# Patient Record
Sex: Male | Born: 1990 | Race: Black or African American | Hispanic: No | Marital: Single | Smoking: Current every day smoker
Health system: Southern US, Community
[De-identification: ages and names within clinical notes are randomized; demographics above are authoritative.]

## PROBLEM LIST (undated history)

## (undated) ENCOUNTER — Emergency Department (HOSPITAL_COMMUNITY): Admission: EM | Payer: Self-pay

## (undated) HISTORY — PX: NO PAST SURGERIES: SHX2092

---
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
## 2013-06-29 ENCOUNTER — Emergency Department (HOSPITAL_COMMUNITY)
Admission: EM | Admit: 2013-06-29 | Discharge: 2013-06-29 | Disposition: A | Discharge status: Home / Self Care | Payer: Self-pay | Attending: Emergency Medicine | Admitting: Emergency Medicine

## 2013-06-29 ENCOUNTER — Encounter (HOSPITAL_COMMUNITY): Payer: Self-pay | Admitting: Emergency Medicine

## 2013-06-29 DIAGNOSIS — F17200 Nicotine dependence, unspecified, uncomplicated: Secondary | ICD-10-CM | POA: Insufficient documentation

## 2013-06-29 DIAGNOSIS — J020 Streptococcal pharyngitis: Secondary | ICD-10-CM | POA: Insufficient documentation

## 2013-06-29 DIAGNOSIS — R0989 Other specified symptoms and signs involving the circulatory and respiratory systems: Secondary | ICD-10-CM | POA: Insufficient documentation

## 2013-06-29 DIAGNOSIS — R05 Cough: Secondary | ICD-10-CM | POA: Insufficient documentation

## 2013-06-29 DIAGNOSIS — J3489 Other specified disorders of nose and nasal sinuses: Secondary | ICD-10-CM | POA: Insufficient documentation

## 2013-06-29 DIAGNOSIS — R059 Cough, unspecified: Secondary | ICD-10-CM | POA: Insufficient documentation

## 2013-06-29 DIAGNOSIS — R0609 Other forms of dyspnea: Secondary | ICD-10-CM | POA: Insufficient documentation

## 2013-06-29 LAB — RAPID STREP SCREEN (MED CTR MEBANE ONLY): Streptococcus, Group A Screen (Direct): POSITIVE — AB

## 2013-06-29 MED ORDER — ACETAMINOPHEN-CODEINE #3 300-30 MG PO TABS: 1.0000 | ORAL_TABLET | Freq: Four times a day (QID) | ORAL | Status: DC | PRN

## 2013-06-29 MED ORDER — DEXAMETHASONE SODIUM PHOSPHATE 10 MG/ML IJ SOLN
10.0000 mg | Freq: Once | INTRAMUSCULAR | Status: AC
  Administered 2013-06-29: 10 mg via INTRAMUSCULAR
  Filled 2013-06-29: qty 1

## 2013-06-29 MED ORDER — PENICILLIN V POTASSIUM 500 MG PO TABS: 500.0000 mg | ORAL_TABLET | Freq: Three times a day (TID) | ORAL | Status: DC

## 2013-06-29 NOTE — ED Provider Notes (Signed)
Medical screening examination/treatment/procedure(s) were performed by non-physician practitioner and as supervising physician I was immediately available for consultation/collaboration.  EKG Interpretation   None         Morna Flud T Nira Visscher, MD 06/29/13 1713 

## 2013-06-29 NOTE — ED Notes (Signed)
Pt reports nonproductive cough, chills sore throat x 4 days. Tx with OTC meds

## 2013-06-29 NOTE — ED Provider Notes (Signed)
CSN: [id number]     Arrival date & time 06/29/13  1223 History  This chart was scribed for Brett Pel, PA-C, working with Audree Camel, MD, by [facility] ED Scribe. This patient was seen in room WTR5/WTR5 and the patient's care was started at 1:10 PM.   Chief Complaint  Patient presents with  . Sore Throat    x 4 days  . Cough    dry cough    The history is provided by the patient. No language interpreter was used.    HPI Comments: Brett Lang is a 22 y.o. male who presents to the Emergency Department complaining of a constant, gradually worsening sore throat over the past week. He reports an associated non-productive cough and nasal congestion over the past week. He states that he has had some trouble breathing at night due to congestion. He also reports intermittent subjective fever and chills over the past week. He states that he has not taken his temperature manually. He denies nausea, emesis, diarrhea, constipation, headache, eye pain, ear pain or any other recent symptoms.  History reviewed. No pertinent past medical history. History reviewed. No pertinent past surgical history. Family History  Problem Relation Age of Onset  . Diabetes Other    History  Substance Use Topics  . Smoking status: Current Every Day Smoker  . Smokeless tobacco: Not on file  . Alcohol Use: Yes    Review of Systems  Constitutional: Positive for fever (subjective) and chills.  HENT: Positive for congestion and sore throat. Negative for ear pain.   Eyes: Negative for pain.  Respiratory: Positive for cough.   Gastrointestinal: Negative for nausea, vomiting, diarrhea and constipation.  Neurological: Negative for headaches.  All other systems reviewed and are negative.   Allergies  Review of patient's allergies indicates no known allergies.  Home Medications   Current Outpatient Rx  Name  Route  Sig  Dispense  Refill  . acetaminophen-codeine (TYLENOL #3) 300-30 MG per tablet    Oral   Take 1-2 tablets by mouth every 6 (six) hours as needed for moderate pain.   15 tablet   0   . penicillin v potassium (VEETID) 500 MG tablet   Oral   Take 1 tablet (500 mg total) by mouth 3 (three) times daily.   30 tablet   0     Triage Vitals: BP 124/70  Pulse 90  Temp(Src) 98.2 F (36.8 C) (Oral)  Resp 18  SpO2 99%  Physical Exam  Nursing note and vitals reviewed. Constitutional: He is oriented to person, place, and time. He appears well-developed and well-nourished. No distress.  HENT:  Head: Normocephalic and atraumatic.  Right Ear: Tympanic membrane, external ear and ear canal normal.  Left Ear: Tympanic membrane, external ear and ear canal normal.  Nose: Nose normal. No rhinorrhea. Right sinus exhibits no maxillary sinus tenderness and no frontal sinus tenderness. Left sinus exhibits no maxillary sinus tenderness and no frontal sinus tenderness.  Mouth/Throat: Uvula is midline and mucous membranes are normal. No trismus in the jaw. Normal dentition. No dental abscesses or uvula swelling. Oropharyngeal exudate and posterior oropharyngeal edema present. No posterior oropharyngeal erythema or tonsillar abscesses.  No submental edema, tongue not elevated, no trismus. No impending airway obstruction; Pt able to speak full sentences, swallow intact, no drooling, stridor, or tonsillar/uvula displacement. No palatal petechia  Eyes: Conjunctivae and EOM are normal.  Neck: Trachea normal, normal range of motion and full passive range of motion without pain.  Neck supple. No rigidity. No tracheal deviation and normal range of motion present. No Brudzinski's sign noted.  Flexion and extension of neck without pain or difficulty. Able to breath without difficulty in extension.  Cardiovascular: Normal rate and regular rhythm.   Pulmonary/Chest: Effort normal and breath sounds normal. No stridor. No respiratory distress. He has no wheezes.  Abdominal: Soft. There is no tenderness.   No obvious evidence of splenomegaly. Non ttp.   Musculoskeletal: Normal range of motion.  Lymphadenopathy:       Head (right side): No preauricular and no posterior auricular adenopathy present.       Head (left side): No preauricular and no posterior auricular adenopathy present.    He has cervical adenopathy.  Neurological: He is alert and oriented to person, place, and time.  Skin: Skin is warm and dry. No rash noted. He is not diaphoretic.  Psychiatric: He has a normal mood and affect. His behavior is normal.    ED Course  Procedures (including critical care time)  DIAGNOSTIC STUDIES: Oxygen Saturation is 99% on RA, normal by my interpretation.    COORDINATION OF CARE: 1:15 PM- Pt advised of plan for treatment and pt agrees.  1:24 PM- Advised pt that he tested positive for Strep. Discussed plan for pt to be discharged with pain medication and antibiotics. Pt offered a shot of decadron in the ED and he agrees with this option.  acetaminophen-codeine (TYLENOL #3) 300-30 MG per tablet Take 1-2 tablets by mouth every 6 (six) hours as needed for moderate pain. 15 tablet Dorthula Matas, PA-C   penicillin v potassium (VEETID) 500 MG tablet Take 1 tablet (500 mg total) by mouth 3 (three) times daily. 30 tablet Dorthula Matas, PA-C        Labs Review Labs Reviewed  RAPID STREP SCREEN - Abnormal; Notable for the following:    Streptococcus, Group A Screen (Direct) POSITIVE (*)    All other components within normal limits   Imaging Review No results found.  EKG Interpretation   None       MDM   1. Strep throat      22 y.o.Karl S Lawford's evaluation in the Emergency Department is complete. It has been determined that no acute conditions requiring further emergency intervention are present at this time. The patient/guardian have been advised of the diagnosis and plan. We have discussed signs and symptoms that warrant return to the ED, such as changes or worsening in  symptoms.  Vital signs are stable at discharge. Filed Vitals:   06/29/13 1254  BP: 124/70  Pulse: 90  Temp: 98.2 F (36.8 C)  Resp: 18    Patient/guardian has voiced understanding and agreed to follow-up with the PCP or specialist.  I personally performed the services described in this documentation, which was scribed in my presence. The recorded information has been reviewed and is accurate.   Dorthula Matas, PA-C 06/29/13 1327

## 2016-08-14 ENCOUNTER — Emergency Department (HOSPITAL_COMMUNITY)
Admission: EM | Admit: 2016-08-14 | Discharge: 2016-08-14 | Disposition: A | Discharge status: Home / Self Care | Payer: Self-pay | Attending: Emergency Medicine | Admitting: Emergency Medicine

## 2016-08-14 ENCOUNTER — Emergency Department (HOSPITAL_COMMUNITY): Payer: Self-pay

## 2016-08-14 ENCOUNTER — Encounter (HOSPITAL_COMMUNITY): Payer: Self-pay | Admitting: Emergency Medicine

## 2016-08-14 DIAGNOSIS — J111 Influenza due to unidentified influenza virus with other respiratory manifestations: Secondary | ICD-10-CM | POA: Insufficient documentation

## 2016-08-14 DIAGNOSIS — F17200 Nicotine dependence, unspecified, uncomplicated: Secondary | ICD-10-CM | POA: Insufficient documentation

## 2016-08-14 LAB — CBC WITH DIFFERENTIAL/PLATELET
Basophils Absolute: 0 10*3/uL (ref 0.0–0.1)
Basophils Relative: 0 %
Eosinophils Absolute: 0.1 10*3/uL (ref 0.0–0.7)
Eosinophils Relative: 2 %
HCT: 43.5 % (ref 39.0–52.0)
Hemoglobin: 15.7 g/dL (ref 13.0–17.0)
Lymphocytes Relative: 13 %
Lymphs Abs: 0.8 10*3/uL (ref 0.7–4.0)
MCH: 32.3 pg (ref 26.0–34.0)
MCHC: 36.1 g/dL — ABNORMAL HIGH (ref 30.0–36.0)
MCV: 89.5 fL (ref 78.0–100.0)
Monocytes Absolute: 0.6 10*3/uL (ref 0.1–1.0)
Monocytes Relative: 10 %
Neutro Abs: 4.3 10*3/uL (ref 1.7–7.7)
Neutrophils Relative %: 75 %
Platelets: 245 10*3/uL (ref 150–400)
RBC: 4.86 MIL/uL (ref 4.22–5.81)
RDW: 12.7 % (ref 11.5–15.5)
WBC: 5.8 10*3/uL (ref 4.0–10.5)

## 2016-08-14 LAB — COMPREHENSIVE METABOLIC PANEL
ALT: 20 U/L (ref 17–63)
AST: 25 U/L (ref 15–41)
Albumin: 3.8 g/dL (ref 3.5–5.0)
Alkaline Phosphatase: 65 U/L (ref 38–126)
Anion gap: 7 (ref 5–15)
BUN: 10 mg/dL (ref 6–20)
CO2: 23 mmol/L (ref 22–32)
Calcium: 9 mg/dL (ref 8.9–10.3)
Chloride: 106 mmol/L (ref 101–111)
Creatinine, Ser: 1.41 mg/dL — ABNORMAL HIGH (ref 0.61–1.24)
GFR calc Af Amer: >60 mL/min (ref >60)
GFR calc non Af Amer: >60 mL/min (ref >60)
Glucose, Bld: 91 mg/dL (ref 65–99)
Potassium: 3.8 mmol/L (ref 3.5–5.1)
Sodium: 136 mmol/L (ref 135–145)
Total Bilirubin: 0.3 mg/dL (ref 0.3–1.2)
Total Protein: 7 g/dL (ref 6.5–8.1)

## 2016-08-14 LAB — URINALYSIS, ROUTINE W REFLEX MICROSCOPIC
Bilirubin Urine: NEGATIVE
Glucose, UA: NEGATIVE mg/dL
Hgb urine dipstick: NEGATIVE
Ketones, ur: NEGATIVE mg/dL
Leukocytes, UA: NEGATIVE
Nitrite: NEGATIVE
Protein, ur: NEGATIVE mg/dL
Specific Gravity, Urine: 1.021 (ref 1.005–1.030)
pH: 6 (ref 5.0–8.0)

## 2016-08-14 LAB — I-STAT CG4 LACTIC ACID, ED: Lactic Acid, Venous: 1.24 mmol/L (ref 0.5–1.9)

## 2016-08-14 MED ORDER — ACETAMINOPHEN 500 MG PO TABS
500.0000 mg | ORAL_TABLET | Freq: Once | ORAL | Status: AC
  Administered 2016-08-14: 500 mg via ORAL
  Filled 2016-08-14: qty 1

## 2016-08-14 MED ORDER — ACETAMINOPHEN 325 MG PO TABS
650.0000 mg | ORAL_TABLET | Freq: Once | ORAL | Status: AC
  Administered 2016-08-14: 650 mg via ORAL

## 2016-08-14 MED ORDER — OSELTAMIVIR PHOSPHATE 75 MG PO CAPS: 75.0000 mg | ORAL_CAPSULE | Freq: Two times a day (BID) | ORAL | 0 refills | Status: DC

## 2016-08-14 MED ORDER — ACETAMINOPHEN 325 MG PO TABS
ORAL_TABLET | ORAL | Status: AC
  Filled 2016-08-14: qty 2

## 2016-08-14 NOTE — ED Triage Notes (Signed)
Pt has a fever of 103.1-- girlfriend has flu-- has been on tamiflu-- pt took one of girlfriend's tamiflu tablets. Cough/fever/headache/backache

## 2016-08-14 NOTE — ED Provider Notes (Signed)
[facility]-EMERGENCY DEPT Provider Note   CSN: [id number] Arrival date & time: 08/14/16  1648     History   Chief Complaint Chief Complaint  Patient presents with  . flu like symptoms  . Fever    HPI Brett Lang is a 25 y.o. male.  HPI Patient has had URI symptoms cough fevers chills and feeling bad. His girlfriend was diagnosed with the flu. Patient also has a 3 day old baby at home. He was told that he needs to be on Tamiflu so baby can go back to the house. No nausea. No vomiting. Slight sore throat with swallowing. History reviewed. No pertinent past medical history.  There are no active problems to display for this patient.   History reviewed. No pertinent surgical history.     Home Medications    Prior to Admission medications   Medication Sig Start Date End Date Taking? Authorizing Provider  acetaminophen-codeine (TYLENOL #3) 300-30 MG per tablet Take 1-2 tablets by mouth every 6 (six) hours as needed for moderate pain. 06/29/13   Marlon Peliffany Greene, PA-C  oseltamivir (TAMIFLU) 75 MG capsule Take 1 capsule (75 mg total) by mouth every 12 (twelve) hours. 08/14/16   Benjiman CoreNathan Emonnie Cannady, MD  penicillin v potassium (VEETID) 500 MG tablet Take 1 tablet (500 mg total) by mouth 3 (three) times daily. 06/29/13   Marlon Peliffany Greene, PA-C    Family History Family History  Problem Relation Age of Onset  . Diabetes Other     Social History Social History  Substance Use Topics  . Smoking status: Current Every Day Smoker  . Smokeless tobacco: Not on file  . Alcohol use Yes     Allergies   Patient has no known allergies.   Review of Systems Review of Systems  Constitutional: Positive for appetite change, fatigue and fever.  HENT: Positive for congestion.   Respiratory: Positive for cough. Negative for shortness of breath.   Gastrointestinal: Negative for abdominal pain, constipation, nausea and vomiting.  Genitourinary: Negative for dysuria and flank pain.    Musculoskeletal: Negative for back pain.  Neurological: Negative for numbness.     Physical Exam Updated Vital Signs BP 115/60   Pulse 93   Temp 102 F (38.9 C) (Oral)   Resp 21   Ht 5\' 6"  (1.676 m)   Wt 175 lb (79.4 kg)   SpO2 99%   BMI 28.25 kg/m   Physical Exam  Constitutional: He appears well-developed.  HENT:  Minimal posterior erythema with one small area of exudate on right tonsil.  Eyes: EOM are normal.  Cardiovascular: Normal rate.   Pulmonary/Chest: Effort normal.  Slightly harsh breath sounds.  Abdominal: Soft. There is no tenderness.  Musculoskeletal: Normal range of motion. He exhibits no edema.  Neurological: He is alert.  Skin: Skin is warm.     ED Treatments / Results  Labs (all labs ordered are listed, but only abnormal results are displayed) Labs Reviewed  COMPREHENSIVE METABOLIC PANEL - Abnormal; Notable for the following:       Result Value   Creatinine, Ser 1.41 (*)    All other components within normal limits  CBC WITH DIFFERENTIAL/PLATELET - Abnormal; Notable for the following:    MCHC 36.1 (*)    All other components within normal limits  URINE CULTURE  URINALYSIS, ROUTINE W REFLEX MICROSCOPIC  I-STAT CG4 LACTIC ACID, ED    EKG  EKG Interpretation None       Radiology Dg Chest 2 View  Result Date: 08/14/2016  CLINICAL DATA:  Cough and chest congestion and fever. EXAM: CHEST  2 VIEW COMPARISON:  None. FINDINGS: Peribronchial thickening consistent with bronchitis. The lungs are otherwise clear. Heart size and vascularity are normal. No bone abnormality. IMPRESSION: Bronchitic changes. Electronically Signed   By: Francene BoyersJames  Maxwell M.D.   On: 08/14/2016 18:08    Procedures Procedures (including critical care time)  Medications Ordered in ED Medications  acetaminophen (TYLENOL) tablet 650 mg (650 mg Oral Given 08/14/16 1709)     Initial Impression / Assessment and Plan / ED Course  I have reviewed the triage vital signs and the  nursing notes.  Pertinent labs & imaging results that were available during my care of the patient were reviewed by me and considered in my medical decision making (see chart for details).  Clinical Course     Patient with fever. Likely influenza. His had symptoms for about 2 days but will treat since he has a 843-day-old daughter at home. She is also reportedly on prophylaxis. Patient does not want IV fluid. Informed of his mildly elevated creatinine. Ask for financial assistance for the Tamiflu but case management is not here at this time.  Final Clinical Impressions(s) / ED Diagnoses   Final diagnoses:  Influenza    New Prescriptions New Prescriptions   OSELTAMIVIR (TAMIFLU) 75 MG CAPSULE    Take 1 capsule (75 mg total) by mouth every 12 (twelve) hours.     Benjiman CoreNathan Menelik Mcfarren, MD 08/14/16 [phone]

## 2016-08-16 LAB — URINE CULTURE: Culture: NO GROWTH

## 2017-02-18 ENCOUNTER — Encounter (HOSPITAL_COMMUNITY): Payer: Self-pay

## 2017-02-18 ENCOUNTER — Emergency Department (HOSPITAL_COMMUNITY): Payer: Self-pay

## 2017-02-18 ENCOUNTER — Emergency Department (HOSPITAL_COMMUNITY)
Admission: EM | Admit: 2017-02-18 | Discharge: 2017-02-18 | Disposition: A | Discharge status: Home / Self Care | Payer: Self-pay | Attending: Emergency Medicine | Admitting: Emergency Medicine

## 2017-02-18 DIAGNOSIS — F17200 Nicotine dependence, unspecified, uncomplicated: Secondary | ICD-10-CM | POA: Insufficient documentation

## 2017-02-18 DIAGNOSIS — M25512 Pain in left shoulder: Secondary | ICD-10-CM | POA: Insufficient documentation

## 2017-02-18 DIAGNOSIS — S4992XA Unspecified injury of left shoulder and upper arm, initial encounter: Secondary | ICD-10-CM

## 2017-02-18 MED ORDER — KETOROLAC TROMETHAMINE 60 MG/2ML IM SOLN
60.0000 mg | Freq: Once | INTRAMUSCULAR | Status: AC
  Administered 2017-02-18: 60 mg via INTRAMUSCULAR
  Filled 2017-02-18: qty 2

## 2017-02-18 MED ORDER — HYDROCODONE-ACETAMINOPHEN 5-325 MG PO TABS: 1.0000 | ORAL_TABLET | ORAL | 0 refills | Status: DC | PRN
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
## 2020-05-04 ENCOUNTER — Other Ambulatory Visit: Payer: Self-pay

## 2020-05-04 ENCOUNTER — Emergency Department (HOSPITAL_COMMUNITY): Payer: Self-pay

## 2020-05-04 ENCOUNTER — Emergency Department (HOSPITAL_COMMUNITY)
Admission: EM | Admit: 2020-05-04 | Discharge: 2020-05-05 | Disposition: A | Discharge status: Home / Self Care | Payer: Self-pay | Attending: Emergency Medicine | Admitting: Emergency Medicine

## 2020-05-04 ENCOUNTER — Encounter (HOSPITAL_COMMUNITY): Payer: Self-pay | Admitting: Emergency Medicine

## 2020-05-04 DIAGNOSIS — S92912B Unspecified fracture of left toe(s), initial encounter for open fracture: Secondary | ICD-10-CM | POA: Insufficient documentation

## 2020-05-04 DIAGNOSIS — Z23 Encounter for immunization: Secondary | ICD-10-CM | POA: Insufficient documentation

## 2020-05-04 DIAGNOSIS — F17210 Nicotine dependence, cigarettes, uncomplicated: Secondary | ICD-10-CM | POA: Insufficient documentation

## 2020-05-04 DIAGNOSIS — W3400XA Accidental discharge from unspecified firearms or gun, initial encounter: Secondary | ICD-10-CM | POA: Insufficient documentation

## 2020-05-04 DIAGNOSIS — Z20822 Contact with and (suspected) exposure to covid-19: Secondary | ICD-10-CM | POA: Insufficient documentation

## 2020-05-04 LAB — SARS CORONAVIRUS 2 BY RT PCR (HOSPITAL ORDER, PERFORMED IN ~~LOC~~ HOSPITAL LAB): SARS Coronavirus 2: NEGATIVE

## 2020-05-04 MED ORDER — HYDROMORPHONE HCL 1 MG/ML IJ SOLN
1.0000 mg | Freq: Once | INTRAMUSCULAR | Status: AC
  Administered 2020-05-04: 1 mg via INTRAVENOUS
  Filled 2020-05-04: qty 1

## 2020-05-04 MED ORDER — CEFAZOLIN SODIUM-DEXTROSE 2-4 GM/100ML-% IV SOLN
2.0000 g | Freq: Once | INTRAVENOUS | Status: AC
  Administered 2020-05-04: 2 g via INTRAVENOUS
  Filled 2020-05-04: qty 100

## 2020-05-04 MED ORDER — KETOROLAC TROMETHAMINE 15 MG/ML IJ SOLN
15.0000 mg | Freq: Once | INTRAMUSCULAR | Status: AC
  Administered 2020-05-04: 15 mg via INTRAVENOUS
  Filled 2020-05-04: qty 1

## 2020-05-04 MED ORDER — CIPROFLOXACIN HCL 500 MG PO TABS: 500.0000 mg | ORAL_TABLET | Freq: Two times a day (BID) | ORAL | 0 refills | Status: AC

## 2020-05-04 MED ORDER — SODIUM CHLORIDE 0.9 % IV BOLUS
500.0000 mL | Freq: Once | INTRAVENOUS | Status: AC
  Administered 2020-05-04: 500 mL via INTRAVENOUS

## 2020-05-04 MED ORDER — MORPHINE SULFATE (PF) 4 MG/ML IV SOLN
4.0000 mg | Freq: Once | INTRAVENOUS | Status: AC
  Administered 2020-05-04: 4 mg via INTRAVENOUS
  Filled 2020-05-04: qty 1

## 2020-05-04 MED ORDER — TETANUS-DIPHTH-ACELL PERTUSSIS 5-2.5-18.5 LF-MCG/0.5 IM SUSP
0.5000 mL | Freq: Once | INTRAMUSCULAR | Status: AC
  Administered 2020-05-04: 0.5 mL via INTRAMUSCULAR
  Filled 2020-05-04: qty 0.5

## 2020-05-04 MED ORDER — KETAMINE HCL 50 MG/5ML IJ SOSY
0.3000 mg/kg | PREFILLED_SYRINGE | Freq: Once | INTRAMUSCULAR | Status: AC
  Administered 2020-05-04: 24 mg via INTRAVENOUS
  Filled 2020-05-04: qty 5

## 2020-05-04 MED ORDER — OXYCODONE-ACETAMINOPHEN 5-325 MG PO TABS
1.0000 | ORAL_TABLET | Freq: Once | ORAL | Status: AC
  Administered 2020-05-04: 1 via ORAL
  Filled 2020-05-04: qty 1

## 2020-05-04 MED ORDER — OXYCODONE-ACETAMINOPHEN 5-325 MG PO TABS: 1.0000 | ORAL_TABLET | Freq: Four times a day (QID) | ORAL | 0 refills | Status: DC | PRN

## 2020-05-04 NOTE — Discharge Instructions (Addendum)
Please change the dressing twice a day.  Please do not bear any weight on your left leg. Do not soak or submerge your wounds.  If you do that you will get infected despite taking antibiotics. Please call Dr. Warren Danes office first thing in the morning to set up a follow-up appointment.  You may have diarrhea from the antibiotics.  It is very important that you continue to take the antibiotics even if you get diarrhea unless a medical professional tells you that you may stop taking them.  If you stop too early the bacteria you are being treated for will become stronger and you may need different, more powerful antibiotics that have more side effects and worsening diarrhea.  Please stay well hydrated and consider probiotics as they may decrease the severity of your diarrhea.   Please take Ibuprofen (Advil, motrin) and Tylenol (acetaminophen) to relieve your pain.  You may take up to 600 MG (3 pills) of normal strength ibuprofen every 8 hours as needed.  In between doses of ibuprofen you make take tylenol, up to 1,000 mg (two extra strength pills).  Do not take more than 3,000 mg tylenol in a 24 hour period.  Please check all medication labels as many medications such as pain and cold medications may contain tylenol.  Do not drink alcohol while taking these medications.  Do not take other NSAID'S while taking ibuprofen (such as aleve or naproxen).  Please take ibuprofen with food to decrease stomach upset.  Today you received medications that may make you sleepy or impair your ability to make decisions.  For the next 24 hours please do not drive, operate heavy machinery, care for a small child with out another adult present, or perform any activities that may cause harm to you or someone else if you were to fall asleep or be impaired.   You are being prescribed a medication which may make you sleepy. Please follow up of listed precautions for at least 24 hours after taking one dose.
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
## 2020-05-05 NOTE — Progress Notes (Signed)
Orthopedic Tech Progress Note Patient Details:  Brett Lang February 24, 1991 [id number]  Ortho Devices Type of Ortho Device: Crutches, CAM walker Ortho Device/Splint Location: lle Ortho Device/Splint Interventions: Ordered, Application, Adjustment   Post Interventions Patient Tolerated: Well Instructions Provided: Care of device, Adjustment of device   Trinna Post 05/05/2020, 12:38 AM

## 2020-05-06 ENCOUNTER — Telehealth: Payer: Self-pay

## 2020-05-06 NOTE — Telephone Encounter (Signed)
Called patient to get him an appt on Wed. No answer.

## 2020-05-06 NOTE — Telephone Encounter (Signed)
Called patient no answer. Could not leave VM. Will try again later.  

## 2020-05-06 NOTE — Telephone Encounter (Signed)
needs f/u appt Received: Today Tarry Kos, MD  Albertina Parr, RMA Marisue Ivan,  This patient came through ED Sunday night. Can we get him in to see me wed morning please. Thanks.

## 2020-05-07 NOTE — Telephone Encounter (Signed)
Called again no answer  

## 2020-05-07 NOTE — Telephone Encounter (Signed)
I've tried to contact patient multiple times. No answer. Could not leave VM. He is scheduled to see Mardella Layman 05/12/2020.    Mardella Layman are you okay seeing patient?

## 2020-05-08 NOTE — Telephone Encounter (Signed)
Patient did not answer again °

## 2020-05-08 NOTE — Telephone Encounter (Signed)
Can we have him come in tomorrow? 

## 2020-05-09 ENCOUNTER — Encounter: Payer: Self-pay | Admitting: Orthopaedic Surgery

## 2020-05-09 ENCOUNTER — Ambulatory Visit (INDEPENDENT_AMBULATORY_CARE_PROVIDER_SITE_OTHER): Payer: Self-pay | Admitting: Orthopaedic Surgery

## 2020-05-09 VITALS — Ht 66.0 in | Wt 175.0 lb

## 2020-05-09 DIAGNOSIS — W3400XA Accidental discharge from unspecified firearms or gun, initial encounter: Secondary | ICD-10-CM

## 2020-05-09 DIAGNOSIS — S91135A Puncture wound without foreign body of left lesser toe(s) without damage to nail, initial encounter: Secondary | ICD-10-CM

## 2020-05-09 MED ORDER — GABAPENTIN 100 MG PO CAPS: 100.0000 mg | ORAL_CAPSULE | Freq: Three times a day (TID) | ORAL | 3 refills | Status: AC
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
## 2020-05-12 ENCOUNTER — Encounter (HOSPITAL_BASED_OUTPATIENT_CLINIC_OR_DEPARTMENT_OTHER): Payer: Self-pay | Admitting: Orthopaedic Surgery

## 2020-05-12 ENCOUNTER — Other Ambulatory Visit (HOSPITAL_COMMUNITY)
Admission: RE | Admit: 2020-05-12 | Discharge: 2020-05-12 | Disposition: A | Discharge status: Home / Self Care | Payer: HRSA Program | Source: Ambulatory Visit | Attending: Orthopaedic Surgery | Admitting: Orthopaedic Surgery

## 2020-05-12 ENCOUNTER — Other Ambulatory Visit: Payer: Self-pay

## 2020-05-12 ENCOUNTER — Ambulatory Visit: Payer: Self-pay | Admitting: Physician Assistant

## 2020-05-12 DIAGNOSIS — Z01812 Encounter for preprocedural laboratory examination: Secondary | ICD-10-CM | POA: Insufficient documentation

## 2020-05-12 DIAGNOSIS — Z20822 Contact with and (suspected) exposure to covid-19: Secondary | ICD-10-CM | POA: Diagnosis not present

## 2020-05-12 LAB — SARS CORONAVIRUS 2 (TAT 6-24 HRS): SARS Coronavirus 2: NEGATIVE

## 2020-05-15 ENCOUNTER — Encounter (HOSPITAL_BASED_OUTPATIENT_CLINIC_OR_DEPARTMENT_OTHER)
Admission: RE | Disposition: A | Discharge status: Home / Self Care | Payer: Self-pay | Source: Home / Self Care | Attending: Orthopaedic Surgery

## 2020-05-15 ENCOUNTER — Encounter (HOSPITAL_BASED_OUTPATIENT_CLINIC_OR_DEPARTMENT_OTHER): Payer: Self-pay | Admitting: Orthopaedic Surgery

## 2020-05-15 ENCOUNTER — Ambulatory Visit (HOSPITAL_BASED_OUTPATIENT_CLINIC_OR_DEPARTMENT_OTHER)
Admission: RE | Admit: 2020-05-15 | Discharge: 2020-05-15 | Disposition: A | Discharge status: Home / Self Care | Payer: Self-pay | Attending: Orthopaedic Surgery | Admitting: Orthopaedic Surgery

## 2020-05-15 DIAGNOSIS — S91135A Puncture wound without foreign body of left lesser toe(s) without damage to nail, initial encounter: Secondary | ICD-10-CM

## 2020-05-15 DIAGNOSIS — W3400XA Accidental discharge from unspecified firearms or gun, initial encounter: Secondary | ICD-10-CM | POA: Insufficient documentation

## 2020-05-15 DIAGNOSIS — Z79899 Other long term (current) drug therapy: Secondary | ICD-10-CM | POA: Insufficient documentation

## 2020-05-15 DIAGNOSIS — F17200 Nicotine dependence, unspecified, uncomplicated: Secondary | ICD-10-CM | POA: Insufficient documentation

## 2020-05-15 DIAGNOSIS — S91342A Puncture wound with foreign body, left foot, initial encounter: Secondary | ICD-10-CM | POA: Insufficient documentation

## 2020-05-15 SURGERY — IRRIGATION AND DEBRIDEMENT EXTREMITY
Anesthesia: General | Laterality: Left

## 2020-05-15 MED ORDER — LACTATED RINGERS IV SOLN: INTRAVENOUS | Status: DC

## 2020-05-15 MED ORDER — CEFAZOLIN SODIUM-DEXTROSE 2-4 GM/100ML-% IV SOLN
INTRAVENOUS | Status: AC
  Filled 2020-05-15: qty 100

## 2020-05-15 MED ORDER — CEFAZOLIN SODIUM-DEXTROSE 2-4 GM/100ML-% IV SOLN: 2.0000 g | INTRAVENOUS | Status: DC
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
## 2020-05-19 ENCOUNTER — Other Ambulatory Visit: Payer: Self-pay

## 2020-05-19 ENCOUNTER — Encounter (HOSPITAL_BASED_OUTPATIENT_CLINIC_OR_DEPARTMENT_OTHER): Payer: Self-pay | Admitting: Orthopaedic Surgery

## 2020-05-20 ENCOUNTER — Other Ambulatory Visit (HOSPITAL_COMMUNITY)
Admission: RE | Admit: 2020-05-20 | Discharge: 2020-05-20 | Disposition: A | Discharge status: Home / Self Care | Payer: HRSA Program | Source: Ambulatory Visit | Attending: Orthopaedic Surgery | Admitting: Orthopaedic Surgery

## 2020-05-20 DIAGNOSIS — Z20822 Contact with and (suspected) exposure to covid-19: Secondary | ICD-10-CM | POA: Diagnosis present

## 2020-05-20 DIAGNOSIS — Z01812 Encounter for preprocedural laboratory examination: Secondary | ICD-10-CM | POA: Insufficient documentation

## 2020-05-20 LAB — SARS CORONAVIRUS 2 (TAT 6-24 HRS): SARS Coronavirus 2: NEGATIVE

## 2020-05-23 ENCOUNTER — Encounter (HOSPITAL_BASED_OUTPATIENT_CLINIC_OR_DEPARTMENT_OTHER)
Admission: RE | Disposition: A | Discharge status: Home / Self Care | Payer: Self-pay | Source: Home / Self Care | Attending: Orthopaedic Surgery

## 2020-05-23 ENCOUNTER — Ambulatory Visit (HOSPITAL_BASED_OUTPATIENT_CLINIC_OR_DEPARTMENT_OTHER): Payer: Self-pay | Admitting: Certified Registered"

## 2020-05-23 ENCOUNTER — Other Ambulatory Visit: Payer: Self-pay

## 2020-05-23 ENCOUNTER — Ambulatory Visit (HOSPITAL_BASED_OUTPATIENT_CLINIC_OR_DEPARTMENT_OTHER)
Admission: RE | Admit: 2020-05-23 | Discharge: 2020-05-23 | Disposition: A | Discharge status: Home / Self Care | Payer: Self-pay | Attending: Orthopaedic Surgery | Admitting: Orthopaedic Surgery

## 2020-05-23 ENCOUNTER — Encounter (HOSPITAL_BASED_OUTPATIENT_CLINIC_OR_DEPARTMENT_OTHER): Payer: Self-pay | Admitting: Orthopaedic Surgery

## 2020-05-23 DIAGNOSIS — S91135A Puncture wound without foreign body of left lesser toe(s) without damage to nail, initial encounter: Secondary | ICD-10-CM

## 2020-05-23 DIAGNOSIS — Z79899 Other long term (current) drug therapy: Secondary | ICD-10-CM | POA: Insufficient documentation

## 2020-05-23 DIAGNOSIS — W3400XA Accidental discharge from unspecified firearms or gun, initial encounter: Secondary | ICD-10-CM | POA: Insufficient documentation

## 2020-05-23 DIAGNOSIS — S92912B Unspecified fracture of left toe(s), initial encounter for open fracture: Secondary | ICD-10-CM | POA: Insufficient documentation

## 2020-05-23 DIAGNOSIS — F17210 Nicotine dependence, cigarettes, uncomplicated: Secondary | ICD-10-CM | POA: Insufficient documentation

## 2020-05-23 HISTORY — PX: I & D EXTREMITY: SHX5045

## 2020-05-23 SURGERY — IRRIGATION AND DEBRIDEMENT EXTREMITY
Anesthesia: General | Site: Leg Lower | Laterality: Left

## 2020-05-23 MED ORDER — FENTANYL CITRATE (PF) 100 MCG/2ML IJ SOLN
INTRAMUSCULAR | Status: AC
  Filled 2020-05-23: qty 2

## 2020-05-23 MED ORDER — LIDOCAINE 2% (20 MG/ML) 5 ML SYRINGE
INTRAMUSCULAR | Status: AC
  Filled 2020-05-23: qty 5

## 2020-05-23 MED ORDER — BUPIVACAINE HCL (PF) 0.5 % IJ SOLN
INTRAMUSCULAR | Status: DC | PRN
  Administered 2020-05-23: 10 mL

## 2020-05-23 MED ORDER — MIDAZOLAM HCL 2 MG/2ML IJ SOLN
INTRAMUSCULAR | Status: AC
  Filled 2020-05-23: qty 2

## 2020-05-23 MED ORDER — OXYCODONE HCL 5 MG PO TABS: 5.0000 mg | ORAL_TABLET | Freq: Once | ORAL | Status: DC | PRN

## 2020-05-23 MED ORDER — PROPOFOL 10 MG/ML IV BOLUS
INTRAVENOUS | Status: AC
  Filled 2020-05-23: qty 20

## 2020-05-23 MED ORDER — FENTANYL CITRATE (PF) 100 MCG/2ML IJ SOLN: 25.0000 ug | INTRAMUSCULAR | Status: DC | PRN

## 2020-05-23 MED ORDER — LIDOCAINE HCL (CARDIAC) PF 100 MG/5ML IV SOSY
PREFILLED_SYRINGE | INTRAVENOUS | Status: DC | PRN
  Administered 2020-05-23: 50 mg via INTRAVENOUS

## 2020-05-23 MED ORDER — ONDANSETRON HCL 4 MG/2ML IJ SOLN
INTRAMUSCULAR | Status: AC
  Filled 2020-05-23: qty 2

## 2020-05-23 MED ORDER — PROMETHAZINE HCL 25 MG/ML IJ SOLN: 6.2500 mg | INTRAMUSCULAR | Status: DC | PRN

## 2020-05-23 MED ORDER — DEXAMETHASONE SODIUM PHOSPHATE 4 MG/ML IJ SOLN
INTRAMUSCULAR | Status: DC | PRN
  Administered 2020-05-23: 5 mg via INTRAVENOUS

## 2020-05-23 MED ORDER — GLYCOPYRROLATE PF 0.2 MG/ML IJ SOSY
PREFILLED_SYRINGE | INTRAMUSCULAR | Status: AC
  Filled 2020-05-23: qty 1

## 2020-05-23 MED ORDER — OXYCODONE-ACETAMINOPHEN 5-325 MG PO TABS: 1.0000 | ORAL_TABLET | Freq: Two times a day (BID) | ORAL | 0 refills | Status: AC | PRN

## 2020-05-23 MED ORDER — BUPIVACAINE HCL (PF) 0.5 % IJ SOLN
INTRAMUSCULAR | Status: AC
  Filled 2020-05-23: qty 30

## 2020-05-23 MED ORDER — LACTATED RINGERS IV SOLN: INTRAVENOUS | Status: DC

## 2020-05-23 MED ORDER — CEFAZOLIN SODIUM-DEXTROSE 2-4 GM/100ML-% IV SOLN
2.0000 g | INTRAVENOUS | Status: AC
  Administered 2020-05-23: 2 g via INTRAVENOUS

## 2020-05-23 MED ORDER — MIDAZOLAM HCL 5 MG/5ML IJ SOLN
INTRAMUSCULAR | Status: DC | PRN
  Administered 2020-05-23: 2 mg via INTRAVENOUS

## 2020-05-23 MED ORDER — OXYCODONE HCL 5 MG/5ML PO SOLN: 5.0000 mg | Freq: Once | ORAL | Status: DC | PRN

## 2020-05-23 MED ORDER — ONDANSETRON HCL 4 MG/2ML IJ SOLN
INTRAMUSCULAR | Status: DC | PRN
  Administered 2020-05-23: 4 mg via INTRAVENOUS

## 2020-05-23 MED ORDER — FENTANYL CITRATE (PF) 100 MCG/2ML IJ SOLN
INTRAMUSCULAR | Status: DC | PRN
Start: 2020-05-23 — End: 2020-05-23
  Administered 2020-05-23: 25 ug via INTRAVENOUS
  Administered 2020-05-23 (×2): 50 ug via INTRAVENOUS

## 2020-05-23 MED ORDER — GLYCOPYRROLATE 0.2 MG/ML IJ SOLN
INTRAMUSCULAR | Status: DC | PRN
  Administered 2020-05-23: .2 mg via INTRAVENOUS

## 2020-05-23 MED ORDER — DEXAMETHASONE SODIUM PHOSPHATE 10 MG/ML IJ SOLN
INTRAMUSCULAR | Status: AC
  Filled 2020-05-23: qty 1

## 2020-05-23 MED ORDER — PROPOFOL 10 MG/ML IV BOLUS
INTRAVENOUS | Status: DC | PRN
  Administered 2020-05-23: 100 mg via INTRAVENOUS
  Administered 2020-05-23: 200 mg via INTRAVENOUS

## 2020-05-23 MED ORDER — DOXYCYCLINE HYCLATE 100 MG PO TABS: 100.0000 mg | ORAL_TABLET | Freq: Two times a day (BID) | ORAL | 0 refills | Status: DC

## 2020-05-23 MED ORDER — CEFAZOLIN SODIUM-DEXTROSE 2-4 GM/100ML-% IV SOLN
INTRAVENOUS | Status: AC
  Filled 2020-05-23: qty 100

## 2020-05-23 SURGICAL SUPPLY — 70 items
ADH SKN CLS APL DERMABOND .7 (GAUZE/BANDAGES/DRESSINGS)
BAND INSRT 18 STRL LF DISP RB (MISCELLANEOUS) ×1
BAND RUBBER #18 3X1/16 STRL (MISCELLANEOUS) ×2 IMPLANT
BLADE SURG 15 STRL LF DISP TIS (BLADE) ×2 IMPLANT
BLADE SURG 15 STRL SS (BLADE) ×4
BNDG CMPR 9X4 STRL LF SNTH (GAUZE/BANDAGES/DRESSINGS)
BNDG COHESIVE 1X5 TAN NS LF (GAUZE/BANDAGES/DRESSINGS) ×1 IMPLANT
BNDG COHESIVE 1X5 TAN STRL LF (GAUZE/BANDAGES/DRESSINGS) IMPLANT
BNDG CONFORM 2 STRL LF (GAUZE/BANDAGES/DRESSINGS) ×2 IMPLANT
BNDG ELASTIC 3X5.8 VLCR STR LF (GAUZE/BANDAGES/DRESSINGS) IMPLANT
BNDG ESMARK 4X9 LF (GAUZE/BANDAGES/DRESSINGS) ×1 IMPLANT
BRUSH SCRUB EZ PLAIN DRY (MISCELLANEOUS) ×2 IMPLANT
CORD BIPOLAR FORCEPS 12FT (ELECTRODE) ×1 IMPLANT
COVER BACK TABLE 60X90IN (DRAPES) ×2 IMPLANT
COVER MAYO STAND STRL (DRAPES) ×1 IMPLANT
COVER WAND RF STERILE (DRAPES) IMPLANT
CUFF TOURN SGL QUICK 18X4 (TOURNIQUET CUFF) IMPLANT
CUFF TOURN SGL QUICK 34 (TOURNIQUET CUFF) ×2
CUFF TRNQT CYL 34X4.125X (TOURNIQUET CUFF) IMPLANT
DECANTER SPIKE VIAL GLASS SM (MISCELLANEOUS) ×1 IMPLANT
DERMABOND ADVANCED (GAUZE/BANDAGES/DRESSINGS)
DERMABOND ADVANCED .7 DNX12 (GAUZE/BANDAGES/DRESSINGS) IMPLANT
DRAPE EXTREMITY T 121X128X90 (DISPOSABLE) ×2 IMPLANT
DRAPE IMP U-DRAPE 54X76 (DRAPES) ×2 IMPLANT
DRAPE SURG 17X23 STRL (DRAPES) ×1 IMPLANT
DRAPE U-SHAPE 47X51 STRL (DRAPES) ×1 IMPLANT
ELECT REM PT RETURN 9FT ADLT (ELECTROSURGICAL) ×2
ELECTRODE REM PT RTRN 9FT ADLT (ELECTROSURGICAL) IMPLANT
GAUZE 4X4 16PLY RFD (DISPOSABLE) IMPLANT
GAUZE SPONGE 4X4 12PLY STRL (GAUZE/BANDAGES/DRESSINGS) ×2 IMPLANT
GAUZE XEROFORM 1X8 LF (GAUZE/BANDAGES/DRESSINGS) ×2 IMPLANT
GLOVE BIOGEL PI IND STRL 7.0 (GLOVE) ×1 IMPLANT
GLOVE BIOGEL PI INDICATOR 7.0 (GLOVE) ×1
GLOVE ECLIPSE 7.0 STRL STRAW (GLOVE) ×2 IMPLANT
GLOVE SKINSENSE NS SZ7.5 (GLOVE) ×1
GLOVE SKINSENSE STRL SZ7.5 (GLOVE) ×1 IMPLANT
GLOVE SURG SYN 7.5  E (GLOVE) ×4
GLOVE SURG SYN 7.5 E (GLOVE) ×2 IMPLANT
GLOVE SURG SYN 7.5 PF PI (GLOVE) ×2 IMPLANT
GOWN STRL REIN XL XLG (GOWN DISPOSABLE) ×2 IMPLANT
GOWN STRL REUS W/ TWL LRG LVL3 (GOWN DISPOSABLE) ×1 IMPLANT
GOWN STRL REUS W/ TWL XL LVL3 (GOWN DISPOSABLE) ×2 IMPLANT
GOWN STRL REUS W/TWL LRG LVL3 (GOWN DISPOSABLE) ×2
GOWN STRL REUS W/TWL XL LVL3 (GOWN DISPOSABLE) ×4
MANIFOLD NEPTUNE II (INSTRUMENTS) ×1 IMPLANT
NDL HYPO 25X1 1.5 SAFETY (NEEDLE) IMPLANT
NEEDLE HYPO 25X1 1.5 SAFETY (NEEDLE) ×2 IMPLANT
NS IRRIG 1000ML POUR BTL (IV SOLUTION) IMPLANT
PACK BASIN DAY SURGERY FS (CUSTOM PROCEDURE TRAY) ×2 IMPLANT
PENCIL BUTTON HOLSTER BLD 10FT (ELECTRODE) ×1 IMPLANT
SHEET MEDIUM DRAPE 40X70 STRL (DRAPES) ×2 IMPLANT
SPONGE LAP 4X18 RFD (DISPOSABLE) ×1 IMPLANT
STOCKINETTE 4X48 STRL (DRAPES) IMPLANT
STOCKINETTE TUBULAR 6 INCH (GAUZE/BANDAGES/DRESSINGS) ×1 IMPLANT
SUCTION FRAZIER HANDLE 10FR (MISCELLANEOUS) ×2
SUCTION TUBE FRAZIER 10FR DISP (MISCELLANEOUS) IMPLANT
SUT ETHILON 2 0 FS 18 (SUTURE) IMPLANT
SUT ETHILON 4 0 PS 2 18 (SUTURE) ×2 IMPLANT
SUT VIC AB 0 CT1 27 (SUTURE)
SUT VIC AB 0 CT1 27XBRD ANBCTR (SUTURE) IMPLANT
SUT VIC AB 2-0 CT1 27 (SUTURE)
SUT VIC AB 2-0 CT1 TAPERPNT 27 (SUTURE) IMPLANT
SWAB COLLECTION DEVICE MRSA (MISCELLANEOUS) IMPLANT
SWAB CULTURE ESWAB REG 1ML (MISCELLANEOUS) IMPLANT
SYR BULB EAR ULCER 3OZ GRN STR (SYRINGE) IMPLANT
SYR CONTROL 10ML LL (SYRINGE) ×1 IMPLANT
TOWEL GREEN STERILE FF (TOWEL DISPOSABLE) ×2 IMPLANT
TRAY DSU PREP LF (CUSTOM PROCEDURE TRAY) ×2 IMPLANT
TUBE CONNECTING 20X1/4 (TUBING) ×1 IMPLANT
YANKAUER SUCT BULB TIP NO VENT (SUCTIONS) ×1 IMPLANT

## 2020-05-23 NOTE — Discharge Instructions (Signed)
Post Anesthesia Home Care Instructions  Activity: Get plenty of rest for the remainder of the day. A responsible individual must stay with you for 24 hours following the procedure.  For the next 24 hours, DO NOT: -Drive a car -Advertising copywriter -Drink alcoholic beverages -Take any medication unless instructed by your physician -Make any legal decisions or sign important papers.  Meals: Start with liquid foods such as gelatin or soup. Progress to regular foods as tolerated. Avoid greasy, spicy, heavy foods. If nausea and/or vomiting occur, drink only clear liquids until the nausea and/or vomiting subsides. Call your physician if vomiting continues.  Special Instructions/Symptoms: Your throat may feel dry or sore from the anesthesia or the breathing tube placed in your throat during surgery. If this causes discomfort, gargle with warm salt water. The discomfort should disappear within 24 hours.        Postoperative instructions:  Weightbearing instructions: heel weight bearing  Keep your dressing and/or splint clean and dry at all times.  You can remove your dressing on post-operative day #3 and change with a dry/sterile dressing or Band-Aids as needed thereafter.    Incision instructions:  Do not soak your incision for 3 weeks after surgery.  If the incision gets wet, pat dry and do not scrub the incision.  Pain control:  You have been given a prescription to be taken as directed for post-operative pain control.  In addition, elevate the operative extremity above the heart at all times to prevent swelling and throbbing pain.  Take over-the-counter Colace, 100mg  by mouth twice a day while taking narcotic pain medications to help prevent constipation.  Follow up appointments: 1) 14 days for suture removal and wound check. 2) Dr. as scheduled.   -------------------------------------------------------------------------------------------------------------  After Surgery Pain  Control:  After your surgery, post-surgical discomfort or pain is likely. This discomfort can last several days to a few weeks. At certain times of the day your discomfort may be more intense.  Did you receive a nerve block?  A nerve block can provide pain relief for one hour to two days after your surgery. As long as the nerve block is working, you will experience little or no sensation in the area the surgeon operated on.  As the nerve block wears off, you will begin to experience pain or discomfort. It is very important that you begin taking your prescribed pain medication before the nerve block fully wears off. Treating your pain at the first sign of the block wearing off will ensure your pain is better controlled and more tolerable when full-sensation returns. Do not wait until the pain is intolerable, as the medicine will be less effective. It is better to treat pain in advance than to try and catch up.  General Anesthesia:  If you did not receive a nerve block during your surgery, you will need to start taking your pain medication shortly after your surgery and should continue to do so as prescribed by your surgeon.  Pain Medication:  Most commonly we prescribe Vicodin and Percocet for post-operative pain. Both of these medications contain a combination of acetaminophen (Tylenol) and a narcotic to help control pain.   It takes between 30 and 45 minutes before pain medication starts to work. It is important to take your medication before your pain level gets too intense.   Nausea is a common side effect of many pain medications. You will want to eat something before taking your pain medicine to help prevent nausea.  If you are taking a prescription pain medication that contains acetaminophen, we recommend that you do not take additional over the counter acetaminophen (Tylenol).  Other pain relieving options:   Using a cold pack to ice the affected area a few times a day (15 to 20 minutes  at a time) can help to relieve pain, reduce swelling and bruising.   Elevation of the affected area can also help to reduce pain and swelling.

## 2020-05-23 NOTE — Anesthesia Procedure Notes (Signed)
Procedure Name: LMA Insertion Date/Time: 05/23/2020 11:44 AM Performed by: Cleda Clarks, CRNA Pre-anesthesia Checklist: Patient identified, Emergency Drugs available, Suction available and Patient being monitored Patient Re-evaluated:Patient Re-evaluated prior to induction Oxygen Delivery Method: Circle system utilized Preoxygenation: Pre-oxygenation with 100% oxygen Induction Type: IV induction Ventilation: Mask ventilation without difficulty LMA: LMA inserted LMA Size: 4.0 Number of attempts: 1 Airway Equipment and Method: Bite block Placement Confirmation: positive ETCO2 Tube secured with: Tape Dental Injury: Teeth and Oropharynx as per pre-operative assessment
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
## 2020-05-23 NOTE — Anesthesia Preprocedure Evaluation (Addendum)
Anesthesia Evaluation  Patient identified by MRN, date of birth, ID band Patient awake    Reviewed: Allergy & Precautions, NPO status , Patient's Chart, lab work & pertinent test results  History of Anesthesia Complications Negative for: history of anesthetic complications  Airway Mallampati: II  TM Distance: >3 FB Neck ROM: Full    Dental  (+) Dental Advisory Given, Teeth Intact   Pulmonary Current Smoker and Patient abstained from smoking.,    Pulmonary exam normal        Cardiovascular negative cardio ROS Normal cardiovascular exam     Neuro/Psych negative neurological ROS  negative psych ROS   GI/Hepatic negative GI ROS, (+)     substance abuse  marijuana use,   Endo/Other  negative endocrine ROS  Renal/GU negative Renal ROS     Musculoskeletal negative musculoskeletal ROS (+)   Abdominal   Peds  Hematology negative hematology ROS (+)   Anesthesia Other Findings Covid test negative   Reproductive/Obstetrics                            Anesthesia Physical Anesthesia Plan  ASA: II  Anesthesia Plan: General   Post-op Pain Management:    Induction: Intravenous  PONV Risk Score and Plan: 1 and Treatment may vary due to age or medical condition, Ondansetron and Midazolam  Airway Management Planned: LMA  Additional Equipment: None  Intra-op Plan:   Post-operative Plan: Extubation in OR  Informed Consent: I have reviewed the patients History and Physical, chart, labs and discussed the procedure including the risks, benefits and alternatives for the proposed anesthesia with the patient or authorized representative who has indicated his/her understanding and acceptance.     Dental advisory given  Plan Discussed with: CRNA and Anesthesiologist  Anesthesia Plan Comments:        Anesthesia Quick Evaluation
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
## 2020-05-27 ENCOUNTER — Encounter (HOSPITAL_BASED_OUTPATIENT_CLINIC_OR_DEPARTMENT_OTHER): Payer: Self-pay | Admitting: Orthopaedic Surgery

## 2020-10-15 ENCOUNTER — Emergency Department (HOSPITAL_COMMUNITY)
Admission: EM | Admit: 2020-10-15 | Discharge: 2020-10-15 | Disposition: A | Discharge status: Home / Self Care | Payer: Self-pay | Attending: Emergency Medicine | Admitting: Emergency Medicine

## 2020-10-15 ENCOUNTER — Encounter (HOSPITAL_COMMUNITY): Payer: Self-pay

## 2020-10-15 ENCOUNTER — Other Ambulatory Visit: Payer: Self-pay

## 2020-10-15 DIAGNOSIS — R369 Urethral discharge, unspecified: Secondary | ICD-10-CM | POA: Insufficient documentation

## 2020-10-15 DIAGNOSIS — F17210 Nicotine dependence, cigarettes, uncomplicated: Secondary | ICD-10-CM | POA: Insufficient documentation

## 2020-10-15 LAB — RAPID HIV SCREEN (HIV 1/2 AB+AG)
HIV 1/2 Antibodies: NONREACTIVE
HIV-1 P24 Antigen - HIV24: NONREACTIVE

## 2020-10-15 MED ORDER — CEFTRIAXONE SODIUM 1 G IJ SOLR
500.0000 mg | Freq: Once | INTRAMUSCULAR | Status: AC
  Administered 2020-10-15: 500 mg via INTRAMUSCULAR
  Filled 2020-10-15: qty 10

## 2020-10-15 MED ORDER — DOXYCYCLINE HYCLATE 100 MG PO TABS
100.0000 mg | ORAL_TABLET | Freq: Once | ORAL | Status: AC
  Administered 2020-10-15: 100 mg via ORAL
  Filled 2020-10-15: qty 1

## 2020-10-15 MED ORDER — DOXYCYCLINE HYCLATE 100 MG PO CAPS: 100.0000 mg | ORAL_CAPSULE | Freq: Two times a day (BID) | ORAL | 0 refills | Status: AC

## 2020-10-15 NOTE — ED Triage Notes (Signed)
Pt arrived via walk in, requesting STD check. Painful urination and clear penile discharge x3 days.

## 2020-10-15 NOTE — Discharge Instructions (Addendum)
You have been treated presumptively today for gonorrhea and you have been prescribed medication to cover for chlamydia.  Please take your antibiotic, as prescribed.  Take with food.  You have been tested today for gonorrhea and chlamydia. These results will be available in approximately 3 days. You may check your MyChart account for results. Please inform all sexual partners of positive results and that they should be tested and treated as well.  You have also been tested for syphilis and HIV and those results should also be available soon.  Please wait 2 weeks and be sure that you and your partners are symptom free before returning to sexual activity. Please use protection with every sexual encounter.  Follow Up: Please followup with your primary doctor in 3 days for discussion of your diagnoses and further evaluation after today's visit; if you do not have a primary care doctor use the resource guide provided to find one; Please return to the ER for worsening symptoms, high fevers or persistent vomiting.
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
## 2020-10-16 LAB — GC/CHLAMYDIA PROBE AMP (~~LOC~~) NOT AT ARMC
Chlamydia: NEGATIVE
Comment: NEGATIVE
Comment: NORMAL
Neisseria Gonorrhea: POSITIVE — AB

## 2020-10-16 LAB — RPR: RPR Ser Ql: NONREACTIVE

## 2020-10-20 ENCOUNTER — Telehealth: Payer: Self-pay | Admitting: General Practice

## 2020-10-20 NOTE — Telephone Encounter (Signed)
Pt called for his test results from [facility] -ED spoke with Patty. Patty stated that she was unable to give results. Advised to call Beth. Beth stated that she would check with her supervisor as to if she could give results to the pt
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
## 2022-03-27 ENCOUNTER — Emergency Department (HOSPITAL_COMMUNITY)
Admission: EM | Admit: 2022-03-27 | Discharge: 2022-03-27 | Disposition: A | Discharge status: Home / Self Care | Payer: Self-pay | Attending: Emergency Medicine | Admitting: Emergency Medicine

## 2022-03-27 ENCOUNTER — Other Ambulatory Visit: Payer: Self-pay

## 2022-03-27 ENCOUNTER — Encounter (HOSPITAL_COMMUNITY): Payer: Self-pay

## 2022-03-27 DIAGNOSIS — N342 Other urethritis: Secondary | ICD-10-CM | POA: Insufficient documentation

## 2022-03-27 LAB — URINALYSIS, ROUTINE W REFLEX MICROSCOPIC
Bacteria, UA: NONE SEEN
Bilirubin Urine: NEGATIVE
Glucose, UA: NEGATIVE mg/dL
Ketones, ur: NEGATIVE mg/dL
Nitrite: NEGATIVE
Protein, ur: NEGATIVE mg/dL
Specific Gravity, Urine: 1.019 (ref 1.005–1.030)
WBC, UA: >50 WBC/hpf — ABNORMAL HIGH (ref 0–5)
pH: 6 (ref 5.0–8.0)

## 2022-03-27 MED ORDER — DOXYCYCLINE HYCLATE 100 MG PO CAPS: 100.0000 mg | ORAL_CAPSULE | Freq: Two times a day (BID) | ORAL | 0 refills | Status: DC

## 2022-03-27 MED ORDER — CEFTRIAXONE SODIUM 1 G IJ SOLR
500.0000 mg | Freq: Once | INTRAMUSCULAR | Status: AC
  Administered 2022-03-27: 500 mg via INTRAMUSCULAR
  Filled 2022-03-27: qty 10

## 2022-03-27 MED ORDER — DOXYCYCLINE HYCLATE 100 MG PO TABS
100.0000 mg | ORAL_TABLET | Freq: Once | ORAL | Status: AC
  Administered 2022-03-27: 100 mg via ORAL
  Filled 2022-03-27: qty 1

## 2022-03-27 NOTE — ED Triage Notes (Signed)
Pt reports clear penile discharge x2 weeks. Denies pain with urination, abdominal pain, flank pain, and fever.

## 2022-03-27 NOTE — ED Provider Notes (Signed)
[facility]-EMERGENCY DEPT Provider Note   CSN: [id number] Arrival date & time: 03/27/22  0930     History  Chief Complaint  Patient presents with   Penile Discharge    Brett Lang is a 31 y.o. male.  Patient with history of STI presents to the emergency department to be evaluated for 2 weeks of clear urethral discharge.  He denies dysuria, hematuria, increased frequency or urgency.  He denies any external rashes or lesions.  States that last time he had an infection such as this, the discharge was colored, and this time it is clear.  He is sexually active.  No treatments prior to arrival.      Home Medications Prior to Admission medications   Medication Sig Start Date End Date Taking? Authorizing Provider  gabapentin (NEURONTIN) 100 MG capsule Take 1 capsule (100 mg total) by mouth 3 (three) times daily. 05/09/20   Tarry Kos, MD  oxyCODONE-acetaminophen (PERCOCET) 5-325 MG tablet Take 1-2 tablets by mouth 2 (two) times daily as needed for severe pain. 05/23/20   Tarry Kos, MD      Allergies    Patient has no known allergies.    Review of Systems   Review of Systems  Physical Exam Updated Vital Signs BP (!) 159/93 (BP Location: Right Arm)   Pulse 91   Temp 98 F (36.7 C) (Oral)   Resp 16   SpO2 100%   Physical Exam Vitals and nursing note reviewed.  Constitutional:      Appearance: He is well-developed.  HENT:     Head: Normocephalic and atraumatic.  Eyes:     Conjunctiva/sclera: Conjunctivae normal.  Pulmonary:     Effort: No respiratory distress.  Abdominal:     Tenderness: There is no abdominal tenderness. There is no guarding or rebound.  Genitourinary:    Comments: Pt declines external GU exam Musculoskeletal:     Cervical back: Normal range of motion and neck supple.  Skin:    General: Skin is warm and dry.  Neurological:     Mental Status: He is alert.     ED Results / Procedures / Treatments   Labs (all labs  ordered are listed, but only abnormal results are displayed) Labs Reviewed  URINALYSIS, ROUTINE W REFLEX MICROSCOPIC - Abnormal; Notable for the following components:      Result Value   Hgb urine dipstick SMALL (*)    Leukocytes,Ua MODERATE (*)    WBC, UA >50 (*)    All other components within normal limits  GC/CHLAMYDIA PROBE AMP ([place]) NOT AT [facility]    EKG None  Radiology No results found.  Procedures Procedures    Medications Ordered in ED Medications  cefTRIAXone (ROCEPHIN) injection 500 mg (has no administration in time range)  doxycycline (VIBRA-TABS) tablet 100 mg (has no administration in time range)    ED Course/ Medical Decision Making/ A&P    Patient seen and examined.   Labs/EKG: UA demonstrates greater than 50 white blood cells per high-power field  Imaging: None ordered  Medications/Fluids: IM Rocephin, p.o. doxycycline  Most recent vital signs reviewed and are as follows: BP (!) 159/93 (BP Location: Right Arm)   Pulse 91   Temp 98 F (36.7 C) (Oral)   Resp 16   SpO2 100%   Initial impression: Urethritis  Home treatment plan: Patient counseled on safe sexual practices, encouraged them to avoid sexual contact for 7 days and to inform sexual partners so that  they can get tested and treated as well.   Return instructions discussed with patient: Return with worsening or other concerns  Follow-up instructions discussed with patient: Referral information given for health department                          Medical Decision Making Amount and/or Complexity of Data Reviewed Labs: ordered.  Risk Prescription drug management.   Patient with urethral discharge, UA with high white blood cells on microscopy.  Symptoms suspicious for urethritis.  Low concern for orchitis, epididymitis, sepsis.        Final Clinical Impression(s) / ED Diagnoses Final diagnoses:  Urethritis    Rx / DC Orders ED Discharge Orders          Ordered     doxycycline (VIBRAMYCIN) 100 MG capsule  2 times daily        03/27/22 1128              Renne Crigler, PA-C 03/27/22 1130    Lonell Grandchild, MD 03/27/22 1325

## 2022-03-27 NOTE — Discharge Instructions (Signed)
Please read and follow all provided instructions.  Your diagnoses today include:  1. Urethritis     Tests performed today include: Test for gonorrhea and chlamydia.  Test for HIV and syphilis.  You will be notified by telephone with any positive results.  Vital signs. See below for your results today.   Medications:  For treatment of gonorrhea: You were treated with a rocephin (shot) today.   For treatment of chlamydia: you were prescribed doxycyline  You have been prescribed an antibiotic medicine: take the entire course of medicine even if you are feeling better. Stopping early can cause the antibiotic not to work.  Home care instructions:  Read educational materials contained in this packet and follow any instructions provided.   You should tell your partners about your infection and avoid having sex for one week to allow time for the medicine to work.  Sexually transmitted disease testing also available at:  [facility], [facility] [address], [place], phone [phone] or [phone]   Monday - Friday, call for an appointment  Return instructions:  Please return to the Emergency Department if you experience worsening symptoms.  Please return if you have any other emergent concerns.  Additional Information:  Your vital signs today were: BP (!) 159/93 (BP Location: Right Arm)   Pulse 91   Temp 98 F (36.7 C) (Oral)   Resp 16   SpO2 100%  If your blood pressure (BP) was elevated above 135/85 this visit, please have this repeated by your doctor within one month. --------------

## 2023-05-29 ENCOUNTER — Other Ambulatory Visit: Payer: Self-pay

## 2023-05-29 ENCOUNTER — Emergency Department (HOSPITAL_COMMUNITY)
Admission: EM | Admit: 2023-05-29 | Discharge: 2023-05-29 | Disposition: A | Discharge status: Home / Self Care | Payer: BLUE CROSS/BLUE SHIELD | Attending: Emergency Medicine | Admitting: Emergency Medicine

## 2023-05-29 ENCOUNTER — Encounter (HOSPITAL_COMMUNITY): Payer: Self-pay | Admitting: Pharmacy Technician

## 2023-05-29 DIAGNOSIS — Z711 Person with feared health complaint in whom no diagnosis is made: Secondary | ICD-10-CM

## 2023-05-29 DIAGNOSIS — R369 Urethral discharge, unspecified: Secondary | ICD-10-CM | POA: Diagnosis not present

## 2023-05-29 DIAGNOSIS — Z202 Contact with and (suspected) exposure to infections with a predominantly sexual mode of transmission: Secondary | ICD-10-CM | POA: Insufficient documentation

## 2023-05-29 LAB — HIV ANTIBODY (ROUTINE TESTING W REFLEX): HIV Screen 4th Generation wRfx: NONREACTIVE

## 2023-05-29 MED ORDER — CEFTRIAXONE SODIUM 1 G IJ SOLR
500.0000 mg | Freq: Once | INTRAMUSCULAR | Status: AC
  Administered 2023-05-29: 500 mg via INTRAMUSCULAR
  Filled 2023-05-29: qty 10

## 2023-05-29 MED ORDER — DOXYCYCLINE HYCLATE 100 MG PO TABS
100.0000 mg | ORAL_TABLET | Freq: Once | ORAL | Status: AC
  Administered 2023-05-29: 100 mg via ORAL
  Filled 2023-05-29: qty 1

## 2023-05-29 MED ORDER — DOXYCYCLINE HYCLATE 100 MG PO CAPS: 100.0000 mg | ORAL_CAPSULE | Freq: Two times a day (BID) | ORAL | 0 refills | Status: DC

## 2023-05-29 MED ORDER — LIDOCAINE HCL 1 % IJ SOLN
INTRAMUSCULAR | Status: AC
  Administered 2023-05-29: 1 mL
  Filled 2023-05-29: qty 20

## 2023-05-29 NOTE — ED Provider Notes (Signed)
[name] Provider Note   CSN: [id number] Arrival date & time: 05/29/23  1042     History  Chief Complaint  Patient presents with   Exposure to STD    Brett Lang is a 32 y.o. male with medical history significant for I&D of left extremity.  The patient presents to the ED for evaluation of dry flaky skin to his penis as well as several "bumps" that he noticed 2 days ago.  He reports that he is been having unprotected sex with 2 individuals in the last 2 weeks and he is concerned for STIs.  He denies any dysuria but he is endorsing penile discharge.  Denies nausea, vomiting, fevers.   Exposure to STD       Home Medications Prior to Admission medications   Medication Sig Start Date End Date Taking? Authorizing Provider  doxycycline (VIBRAMYCIN) 100 MG capsule Take 1 capsule (100 mg total) by mouth 2 (two) times daily. 05/29/23  Yes Al Decant, PA-C  gabapentin (NEURONTIN) 100 MG capsule Take 1 capsule (100 mg total) by mouth 3 (three) times daily. 05/09/20   Tarry Kos, MD  oxyCODONE-acetaminophen (PERCOCET) 5-325 MG tablet Take 1-2 tablets by mouth 2 (two) times daily as needed for severe pain. 05/23/20   Tarry Kos, MD      Allergies    Patient has no known allergies.    Review of Systems   Review of Systems  Genitourinary:  Positive for penile discharge. Negative for dysuria.  All other systems reviewed and are negative.   Physical Exam Updated Vital Signs BP 120/83 (BP Location: Left Arm)   Pulse 83   Temp 98.8 F (37.1 C) (Oral)   Resp 18   SpO2 100%  Physical Exam Vitals and nursing note reviewed.  Constitutional:      General: He is not in acute distress.    Appearance: Normal appearance. He is not ill-appearing, toxic-appearing or diaphoretic.  HENT:     Head: Normocephalic and atraumatic.     Nose: Nose normal.     Mouth/Throat:     Mouth: Mucous membranes are moist.     Pharynx:  Oropharynx is clear.  Eyes:     Extraocular Movements: Extraocular movements intact.     Conjunctiva/sclera: Conjunctivae normal.     Pupils: Pupils are equal, round, and reactive to light.  Cardiovascular:     Rate and Rhythm: Normal rate and regular rhythm.  Pulmonary:     Effort: Pulmonary effort is normal.     Breath sounds: Normal breath sounds.  Abdominal:     General: Abdomen is flat.     Tenderness: There is no abdominal tenderness.  Genitourinary:    Comments: Scant discharge at urethral meatus Skin:    General: Skin is warm and dry.     Capillary Refill: Capillary refill takes less than 2 seconds.  Neurological:     Mental Status: He is alert and oriented to person, place, and time.     ED Results / Procedures / Treatments   Labs (all labs ordered are listed, but only abnormal results are displayed) Labs Reviewed  HIV ANTIBODY (ROUTINE TESTING W REFLEX)  RPR  GC/CHLAMYDIA PROBE AMP ([place]) NOT AT [facility]    EKG None  Radiology No results found.  Procedures Procedures   Medications Ordered in ED Medications  cefTRIAXone (ROCEPHIN) injection 500 mg (500 mg Intramuscular Given 05/29/23 1216)  doxycycline (VIBRA-TABS) tablet 100 mg (  100 mg Oral Given 05/29/23 1215)  lidocaine (XYLOCAINE) 1 % (with pres) injection (1 mL  Given 05/29/23 1217)    ED Course/ Medical Decision Making/ A&P  Medical Decision Making Amount and/or Complexity of Data Reviewed Labs: ordered.  Risk Prescription drug management.   32 year old male presents to ED for evaluation.  Please see HPI for further details.  On examination patient is afebrile and nontachycardic.  Lung sounds are clear bilaterally, nonhypoxic.  Abdomen soft and compressible throughout.  Neurological examination at baseline.  No external lesions to the genitalia however there is scant discharge at urethral meatus.  Will collect GC/chlamydia screen, HIV, RPR.  Will treat with 500 mg ceftriaxone IM shot,  doxycycline for 1 week twice daily.  Have advised patient to follow-up on the results of testing on MyChart.  I encouraged him to return to the ED for treatment of RPR is positive or if HIV is positive he will need referral to ID.  He voiced understanding with instructions.  He is stable to discharge.   Final Clinical Impression(s) / ED Diagnoses Final diagnoses:  Concern about STD in male without diagnosis    Rx / DC Orders ED Discharge Orders          Ordered    doxycycline (VIBRAMYCIN) 100 MG capsule  2 times daily        05/29/23 1220              Al Decant, [place] 05/29/23 1221    Margarita Grizzle, MD 05/29/23 1421

## 2023-05-29 NOTE — Discharge Instructions (Addendum)
It was a pleasure taking part in your care today.  As we discussed, you will need to follow-up on the results of your testing done here today on MyChart.  If your testing is positive for gonorrhea or chlamydia please continue taking a biotics that I prescribed you.  You will take doxycycline twice daily for the next 7 days.  If your testing is negative you may discontinue use of doxycycline.  If your RPR (syphilis) testing is positive you will need to return to the ED for treatment.

## 2023-05-29 NOTE — ED Triage Notes (Signed)
Pt here due to concern for possible STI. Pt states he has been having unprotected sex and has had some dry skin and peeling to his penis. Also endorses some bumps on his penis that went away.

## 2023-05-30 LAB — RPR: RPR Ser Ql: NONREACTIVE

## 2023-05-30 LAB — GC/CHLAMYDIA PROBE AMP (~~LOC~~) NOT AT ARMC
Chlamydia: NEGATIVE
Comment: NEGATIVE
Comment: NORMAL
Neisseria Gonorrhea: NEGATIVE

## 2023-08-18 ENCOUNTER — Other Ambulatory Visit: Payer: Self-pay

## 2023-08-18 ENCOUNTER — Emergency Department (HOSPITAL_COMMUNITY)
Admission: EM | Admit: 2023-08-18 | Discharge: 2023-08-19 | Disposition: A | Discharge status: Home / Self Care | Payer: Self-pay | Attending: Emergency Medicine | Admitting: Emergency Medicine

## 2023-08-18 ENCOUNTER — Encounter (HOSPITAL_COMMUNITY): Payer: Self-pay

## 2023-08-18 DIAGNOSIS — J069 Acute upper respiratory infection, unspecified: Secondary | ICD-10-CM | POA: Insufficient documentation

## 2023-08-18 DIAGNOSIS — Z20822 Contact with and (suspected) exposure to covid-19: Secondary | ICD-10-CM | POA: Insufficient documentation

## 2023-08-18 DIAGNOSIS — R109 Unspecified abdominal pain: Secondary | ICD-10-CM | POA: Insufficient documentation

## 2023-08-18 LAB — RESP PANEL BY RT-PCR (RSV, FLU A&B, COVID)  RVPGX2
Influenza A by PCR: NEGATIVE
Influenza B by PCR: NEGATIVE
Resp Syncytial Virus by PCR: NEGATIVE
SARS Coronavirus 2 by RT PCR: NEGATIVE

## 2023-08-18 LAB — URINALYSIS, ROUTINE W REFLEX MICROSCOPIC
Bilirubin Urine: NEGATIVE
Glucose, UA: NEGATIVE mg/dL
Hgb urine dipstick: NEGATIVE
Ketones, ur: NEGATIVE mg/dL
Leukocytes,Ua: NEGATIVE
Nitrite: NEGATIVE
Protein, ur: NEGATIVE mg/dL
Specific Gravity, Urine: 1.017 (ref 1.005–1.030)
pH: 6 (ref 5.0–8.0)

## 2023-08-18 LAB — BASIC METABOLIC PANEL
Anion gap: 9 (ref 5–15)
BUN: 12 mg/dL (ref 6–20)
CO2: 23 mmol/L (ref 22–32)
Calcium: 9.1 mg/dL (ref 8.9–10.3)
Chloride: 104 mmol/L (ref 98–111)
Creatinine, Ser: 0.85 mg/dL (ref 0.61–1.24)
GFR, Estimated: >60 mL/min (ref >60)
Glucose, Bld: 95 mg/dL (ref 70–99)
Potassium: 3.9 mmol/L (ref 3.5–5.1)
Sodium: 136 mmol/L (ref 135–145)

## 2023-08-18 LAB — CBC
HCT: 44.2 % (ref 39.0–52.0)
Hemoglobin: 15.2 g/dL (ref 13.0–17.0)
MCH: 31.7 pg (ref 26.0–34.0)
MCHC: 34.4 g/dL (ref 30.0–36.0)
MCV: 92.3 fL (ref 80.0–100.0)
Platelets: 323 10*3/uL (ref 150–400)
RBC: 4.79 MIL/uL (ref 4.22–5.81)
RDW: 13.8 % (ref 11.5–15.5)
WBC: 8.3 10*3/uL (ref 4.0–10.5)
nRBC: 0 % (ref 0.0–0.2)

## 2023-08-18 NOTE — ED Triage Notes (Signed)
 Pt arrives via [facility]. Pt reports congestion, cough, runny nose for about 1 week. Reports some left flank pain as well. Denies n/v/d. Pt AxOx4.

## 2023-08-19 MED ORDER — ACETAMINOPHEN 500 MG PO TABS
1000.0000 mg | ORAL_TABLET | Freq: Once | ORAL | Status: AC
  Administered 2023-08-19: 1000 mg via ORAL
  Filled 2023-08-19: qty 2

## 2023-08-19 NOTE — Discharge Instructions (Signed)
 Please follow-up with your primary care doctor.  Please begin taking Tylenol every 6 hours as needed for your symptoms.  You may also purchase Zicam to help shorten duration of symptoms.  If you develop any painful urination please return to the ER.

## 2023-08-19 NOTE — ED Provider Notes (Signed)
 [place] EMERGENCY DEPARTMENT AT [facility] Provider Note   CSN: [id number] Arrival date & time: 08/18/23  1723     History  Chief Complaint  Patient presents with   Flank Pain   Cough   Nasal Congestion    Brett Lang is a 33 y.o. male who presents to the ED for evaluation of URI symptoms, left-sided flank pain.  Reports that for the last 1 week he has had URI symptoms to include cough, nasal congestion, fever and sore throat.  Denies any known sick contacts.  Denies any medications prior to arrival.  States that he assumed that his symptoms would eventually just dissipate.  Also reporting left-sided flank pain that began today.  Denies any dysuria, nausea, vomiting, diarrhea, hematuria, penile discharge. Denies any trauma.   Flank Pain  Cough Associated symptoms: fever and sore throat        Home Medications Prior to Admission medications   Medication Sig Start Date End Date Taking? Authorizing Provider  doxycycline  (VIBRAMYCIN ) 100 MG capsule Take 1 capsule (100 mg total) by mouth 2 (two) times daily. 05/29/23   Ruthell Lonni FALCON, PA-C  gabapentin  (NEURONTIN ) 100 MG capsule Take 1 capsule (100 mg total) by mouth 3 (three) times daily. 05/09/20   Jerri Kay HERO, MD  oxyCODONE -acetaminophen  (PERCOCET) 5-325 MG tablet Take 1-2 tablets by mouth 2 (two) times daily as needed for severe pain. 05/23/20   Jerri Kay HERO, MD      Allergies    Patient has no known allergies.    Review of Systems   Review of Systems  Constitutional:  Positive for fever.  HENT:  Positive for sore throat.   Respiratory:  Positive for cough.   Genitourinary:  Positive for flank pain.  All other systems reviewed and are negative.   Physical Exam Updated Vital Signs BP 134/89   Pulse 90   Temp 98.7 F (37.1 C) (Oral)   Resp 16   SpO2 97%  Physical Exam Vitals and nursing note reviewed.  Constitutional:      General: He is not in acute distress.    Appearance: Normal  appearance. He is not ill-appearing, toxic-appearing or diaphoretic.  HENT:     Head: Normocephalic and atraumatic.     Nose: Nose normal.     Mouth/Throat:     Mouth: Mucous membranes are moist.     Pharynx: Oropharynx is clear. No oropharyngeal exudate or posterior oropharyngeal erythema.  Eyes:     Extraocular Movements: Extraocular movements intact.     Conjunctiva/sclera: Conjunctivae normal.     Pupils: Pupils are equal, round, and reactive to light.  Cardiovascular:     Rate and Rhythm: Normal rate and regular rhythm.  Pulmonary:     Effort: Pulmonary effort is normal.     Breath sounds: Normal breath sounds. No wheezing.  Abdominal:     General: Abdomen is flat. Bowel sounds are normal.     Palpations: Abdomen is soft.     Tenderness: There is no right CVA tenderness or left CVA tenderness.  Musculoskeletal:     Cervical back: Normal range of motion and neck supple. No tenderness.  Skin:    General: Skin is warm and dry.     Capillary Refill: Capillary refill takes less than 2 seconds.  Neurological:     Mental Status: He is alert and oriented to person, place, and time.     ED Results / Procedures / Treatments   Labs (all labs  ordered are listed, but only abnormal results are displayed) Labs Reviewed  RESP PANEL BY RT-PCR (RSV, FLU A&B, COVID)  RVPGX2  BASIC METABOLIC PANEL  CBC  URINALYSIS, ROUTINE W REFLEX MICROSCOPIC    EKG None  Radiology No results found.  Procedures Procedures   Medications Ordered in ED Medications  acetaminophen  (TYLENOL ) tablet 1,000 mg (has no administration in time range)    ED Course/ Medical Decision Making/ A&P  Medical Decision Making Amount and/or Complexity of Data Reviewed Labs: ordered.    32 year old presenting with URI symptoms.  Please see HPI for further details.  On exam patient is afebrile and nontachycardic.  His lung sounds are clear bilaterally, he is not hypoxic.  His abdomen is soft and  compressible.  No CVA tenderness bilaterally.  Posterior oropharynx has no erythema, no exudate.  His uvula is midline.  He is handling secretions appropriately.  There is no change in phonation.  Patient labwork initiated in triage reviewed.  CBC without leukocytosis or anemia.  Metabolic panel without electrolyte derangement.  Urinalysis without blood, nitrites or leukocytes.  Respiratory panel is negative for all.  Consider CT renal stone study but patient has no hematuria.  Patient denies penile discharge, other infectious symptoms.  Patient most likely suffering from rhinovirus or common cold.  Will send patient home and advised him to treat symptoms conservatively with Tylenol  ibuprofen .  Have advised him to follow-up with his PCP.  He is discharged in stable condition.   Final Clinical Impression(s) / ED Diagnoses Final diagnoses:  Upper respiratory tract infection, unspecified type  Left flank pain    Rx / DC Orders ED Discharge Orders     None         Ruthell Lonni FALCON, PA-C 08/19/23 0254    Midge Golas, MD 08/19/23 [phone]

## 2023-08-20 ENCOUNTER — Other Ambulatory Visit (INDEPENDENT_AMBULATORY_CARE_PROVIDER_SITE_OTHER): Payer: Self-pay | Admitting: Orthopaedic Surgery

## 2024-04-21 ENCOUNTER — Emergency Department (HOSPITAL_COMMUNITY)
Admission: EM | Admit: 2024-04-21 | Discharge: 2024-04-21 | Disposition: A | Discharge status: Home / Self Care | Payer: Self-pay | Attending: Emergency Medicine | Admitting: Emergency Medicine

## 2024-04-21 ENCOUNTER — Other Ambulatory Visit: Payer: Self-pay

## 2024-04-21 ENCOUNTER — Encounter (HOSPITAL_COMMUNITY): Payer: Self-pay

## 2024-04-21 DIAGNOSIS — Z202 Contact with and (suspected) exposure to infections with a predominantly sexual mode of transmission: Secondary | ICD-10-CM | POA: Insufficient documentation

## 2024-04-21 DIAGNOSIS — H209 Unspecified iridocyclitis: Secondary | ICD-10-CM | POA: Insufficient documentation

## 2024-04-21 LAB — URINALYSIS, ROUTINE W REFLEX MICROSCOPIC
Bilirubin Urine: NEGATIVE
Glucose, UA: NEGATIVE mg/dL
Hgb urine dipstick: NEGATIVE
Ketones, ur: NEGATIVE mg/dL
Leukocytes,Ua: NEGATIVE
Nitrite: NEGATIVE
Protein, ur: NEGATIVE mg/dL
Specific Gravity, Urine: 1.017 (ref 1.005–1.030)
pH: 7 (ref 5.0–8.0)

## 2024-04-21 LAB — RAPID HIV SCREEN (HIV 1/2 AB+AG)
HIV 1/2 Antibodies: NONREACTIVE
HIV-1 P24 Antigen - HIV24: NONREACTIVE

## 2024-04-21 MED ORDER — ACETAMINOPHEN 325 MG PO TABS
650.0000 mg | ORAL_TABLET | Freq: Once | ORAL | Status: AC
  Administered 2024-04-21: 650 mg via ORAL
  Filled 2024-04-21: qty 2

## 2024-04-21 MED ORDER — FLUORESCEIN SODIUM 1 MG OP STRP
1.0000 | ORAL_STRIP | Freq: Once | OPHTHALMIC | Status: AC
  Administered 2024-04-21: 1 via OPHTHALMIC
  Filled 2024-04-21: qty 1

## 2024-04-21 MED ORDER — IBUPROFEN 200 MG PO TABS
400.0000 mg | ORAL_TABLET | Freq: Once | ORAL | Status: AC
  Administered 2024-04-21: 400 mg via ORAL
  Filled 2024-04-21: qty 2

## 2024-04-21 MED ORDER — PREDNISOLONE ACETATE 1 % OP SUSP: 1.0000 [drp] | Freq: Four times a day (QID) | OPHTHALMIC | 0 refills | Status: AC

## 2024-04-21 MED ORDER — TETRACAINE HCL 0.5 % OP SOLN
2.0000 [drp] | Freq: Once | OPHTHALMIC | Status: AC
  Administered 2024-04-21: 2 [drp] via OPHTHALMIC
  Filled 2024-04-21: qty 4

## 2024-04-21 NOTE — ED Triage Notes (Signed)
 Pt coming in with complaint of getting in above left eye about a week ago. Small scar noted. No drainage/redness. PT reports since injury he has had sensitivity to light and frequent migraines at night. Pt awake and alert, NAD noted.   Pt also reports wanting to be checked for STDs. No known exposure. No symptoms.
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
## 2024-04-21 NOTE — Discharge Instructions (Addendum)
 You were seen today for traumatic iritis.  It did not look to have any abrasion on your wood lamp examination however we will recommend that you continue to take the Pred forte  eyedrops, 4 times a day until seen by ophthalmology who can provide further recommendations.  Spoke with Dr. Maree who said that he should come to his office between 8 AM and 10 AM on Monday for further evaluation.  Your labs are pending at this time, they will reach out to you if any come back positive.  Return to the ED for new or worsening symptoms.  Please continue to manage headache with Tylenol  and ibuprofen  over-the-counter.  Take Tylenol  (acetominophen)  650mg  every 4-6 hours, as needed for pain or fever. Do not take more than 4,000 mg in a 24-hour period. As this may cause liver damage. While this is rare, if you begin to develop yellowing of the skin or eyes, stop taking and return to ER immediately.  Take Ibuprofen  400mg  every 4-6 hours for pain or fever, not exceeding 3,200 mg per day as more than 3,200mg  can cause Stomach irritation, dizziness, kidney issues with long-term use.

## 2024-04-22 LAB — RPR: RPR Ser Ql: NONREACTIVE

## 2024-04-23 LAB — GC/CHLAMYDIA PROBE AMP (~~LOC~~) NOT AT ARMC
Chlamydia: POSITIVE — AB
Comment: NEGATIVE
Comment: NORMAL
Neisseria Gonorrhea: NEGATIVE

## 2024-04-25 ENCOUNTER — Telehealth: Payer: Self-pay | Admitting: Family Medicine

## 2024-04-25 DIAGNOSIS — A749 Chlamydial infection, unspecified: Secondary | ICD-10-CM

## 2024-04-25 MED ORDER — DOXYCYCLINE HYCLATE 100 MG PO TABS: 100.0000 mg | ORAL_TABLET | Freq: Two times a day (BID) | ORAL | 0 refills | Status: AC
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
## 2024-04-25 NOTE — Patient Instructions (Addendum)
 Brett Lang, thank you for joining Brett CHRISTELLA Barefoot, NP for today's virtual visit.  While this provider is not your primary care provider (PCP), if your PCP is located in our provider database this encounter information will be shared with them immediately following your visit.   A [facility] MyChart account gives you access to today's visit and all your visits, tests, and labs performed at [facility]  click here if you don't have a [facility] MyChart account or go to [email or web address]  Consent: (Patient) Brett Lang provided verbal consent for this virtual visit at the beginning of the encounter.  Current Medications:  Current Outpatient Medications:    doxycycline  (VIBRA -TABS) 100 MG tablet, Take 1 tablet (100 mg total) by mouth 2 (two) times daily for 7 days., Disp: 14 tablet, Rfl: 0   gabapentin  (NEURONTIN ) 100 MG capsule, Take 1 capsule (100 mg total) by mouth 3 (three) times daily., Disp: 30 capsule, Rfl: 3   oxyCODONE -acetaminophen  (PERCOCET) 5-325 MG tablet, Take 1-2 tablets by mouth 2 (two) times daily as needed for severe pain., Disp: 20 tablet, Rfl: 0   prednisoLONE  acetate (PRED FORTE ) 1 % ophthalmic suspension, Place 1 drop into the left eye 4 (four) times daily., Disp: 5 mL, Rfl: 0   Medications ordered in this encounter:  Meds ordered this encounter  Medications   doxycycline  (VIBRA -TABS) 100 MG tablet    Sig: Take 1 tablet (100 mg total) by mouth 2 (two) times daily for 7 days.    Dispense:  14 tablet    Refill:  0    Supervising Provider:   BLAISE ALEENE KIDD [[id number]]     *If you need refills on other medications prior to your next appointment, please contact your pharmacy*  Follow-Up: Call back or seek an in-person evaluation if the symptoms worsen or if the condition fails to improve as anticipated.  [facility] [phone]  Other Instructions  Chlamydia, Male Chlamydia is a sexually transmitted infection (STI).  This infection spreads through sexual contact. Chlamydia can occur in different areas of the body, including: The urethra. This is the part of the body that drains pee (urine) from the bladder and through the penis. The throat. The opening of the butt (rectum). This condition is not hard to treat. But if it is not treated, it can cause worse health problems. What are the causes? This condition is caused by a germ (bacteria) called Chlamydia trachomatis. These germs are spread from an infected partner during sex. The infection can spread through contact with the genitals, mouth, or the opening of the butt. What increases the risk? Not using a condom the right way. Not using a condom every time you have sex. Having a new sex partner. Having more than one sex partner. Being a man who has sex with men. Being sexually active before age 71. What are the signs or symptoms? In some cases, there are no symptoms, especially early in the illness. If you get symptoms, they may include: Peeing often, or a burning feeling when you pee. Pain or swelling in the testicles. Watery, mucus-like fluid (discharge) from the penis. Redness, soreness, swelling, or bleeding, from the butt. Fluid coming from the penis or butt. Pain in the belly (abdomen). Pain during sex. How is this treated? This condition is treated with antibiotic medicines. Follow these instructions at home: Sexual activity Tell your sex partner or partners about your infection. Sex partners are people you had oral, anal, or vaginal  sex with within 60 days of when you started getting sick. They need treatment even if they do not feel or seem sick. Do not have sex until: You and your sex partners have been treated. Your doctor says it is okay. If you get just one dose of medicine, wait at least 7 days before having sex. General instructions Take over-the-counter and prescription medicines as told by your doctor. Finish your antibiotics  even if you start to feel better. It is up to you to get your test results. Ask how to get your results when they are ready. Keep all follow-up visits. You may need tests after 3 months. How is this prevented? To lower your risk: Use latex or polyurethane condoms the right way. Do this every time you have sex. Do not have many sex partners. Ask if your sex partner got tested for STIs and had negative results. Get regular health screenings to check for STIs. Contact a doctor if: You get new symptoms. Your symptoms are getting worse or do not get better with treatment. You have a fever or chills. You have pain during sex. You have pain or soreness in your testicles. You get flu-like symptoms. These may be: Night sweats. Sore throat. Muscle aches. You are not able to take your antibiotic as told. Summary Chlamydia is an infection that spreads through sexual contact. This condition is treated with antibiotics. If it is not treated, it can cause health problems. Your sex partners will also need to be treated. Do not have sex until both you and your partner have been treated. Take all medicines as told and keep all follow-up visits. This information is not intended to replace advice given to you by your health care provider. Make sure you discuss any questions you have with your health care provider. Document Revised: 05/11/2021 Document Reviewed: 05/11/2021 Elsevier Patient Education  2024 [employer]vier [employer]   If you have been instructed to have an in-person evaluation today at a local Urgent Care facility, please use the link below. It will take you to a list of all of our available [facility], including address, phone number and hours of operation. Please do not delay care.  [facility]  If you or a family member do not have a primary care provider, use the link below to schedule a visit and establish care. When you choose a [facility] primary care physician or  advanced practice provider, you gain a long-term partner in health. Find a Primary Care Provider  Learn more about [facility]'s in-office and virtual care options: [facility] - Get Care Now

## 2024-04-26 ENCOUNTER — Ambulatory Visit (HOSPITAL_COMMUNITY): Payer: Self-pay
